# Patient Record
Sex: Male | Born: 1960 | Race: Black or African American | Hispanic: No | Smoking: Current every day smoker
Health system: Southern US, Community
[De-identification: ages and names within clinical notes are randomized; demographics above are authoritative.]

## PROBLEM LIST (undated history)

## (undated) DIAGNOSIS — M549 Dorsalgia, unspecified: Secondary | ICD-10-CM

---

## 1999-07-13 ENCOUNTER — Emergency Department (HOSPITAL_COMMUNITY): Admission: EM | Admit: 1999-07-13 | Discharge: 1999-07-13 | Payer: Self-pay | Admitting: Emergency Medicine

## 2003-03-05 ENCOUNTER — Emergency Department (HOSPITAL_COMMUNITY): Admission: EM | Admit: 2003-03-05 | Discharge: 2003-03-05 | Payer: Self-pay | Admitting: Emergency Medicine

## 2003-03-05 ENCOUNTER — Encounter: Payer: Self-pay | Admitting: *Deleted

## 2003-03-07 ENCOUNTER — Emergency Department (HOSPITAL_COMMUNITY): Admission: EM | Admit: 2003-03-07 | Discharge: 2003-03-07 | Payer: Self-pay | Admitting: *Deleted

## 2003-03-24 ENCOUNTER — Encounter: Payer: Self-pay | Admitting: Emergency Medicine

## 2003-03-24 ENCOUNTER — Emergency Department (HOSPITAL_COMMUNITY): Admission: EM | Admit: 2003-03-24 | Discharge: 2003-03-24 | Payer: Self-pay | Admitting: Emergency Medicine

## 2005-04-30 ENCOUNTER — Emergency Department (HOSPITAL_COMMUNITY): Admission: EM | Admit: 2005-04-30 | Discharge: 2005-04-30 | Payer: Self-pay | Admitting: Emergency Medicine

## 2008-04-14 ENCOUNTER — Emergency Department (HOSPITAL_COMMUNITY): Admission: EM | Admit: 2008-04-14 | Discharge: 2008-04-14 | Payer: Self-pay | Admitting: Family Medicine

## 2015-03-02 ENCOUNTER — Encounter (HOSPITAL_COMMUNITY): Payer: Self-pay | Admitting: Emergency Medicine

## 2015-03-02 ENCOUNTER — Emergency Department (HOSPITAL_COMMUNITY): Payer: Self-pay

## 2015-03-02 ENCOUNTER — Emergency Department (HOSPITAL_COMMUNITY)
Admission: EM | Admit: 2015-03-02 | Discharge: 2015-03-02 | Disposition: A | Discharge status: Home / Self Care | Payer: Self-pay | Attending: Emergency Medicine | Admitting: Emergency Medicine

## 2015-03-02 DIAGNOSIS — M25551 Pain in right hip: Secondary | ICD-10-CM | POA: Insufficient documentation

## 2015-03-02 DIAGNOSIS — M5417 Radiculopathy, lumbosacral region: Secondary | ICD-10-CM | POA: Insufficient documentation

## 2015-03-02 DIAGNOSIS — Z720 Tobacco use: Secondary | ICD-10-CM | POA: Insufficient documentation

## 2015-03-02 DIAGNOSIS — G8929 Other chronic pain: Secondary | ICD-10-CM | POA: Insufficient documentation

## 2015-03-02 DIAGNOSIS — M549 Dorsalgia, unspecified: Secondary | ICD-10-CM

## 2015-03-02 DIAGNOSIS — R200 Anesthesia of skin: Secondary | ICD-10-CM | POA: Insufficient documentation

## 2015-03-02 DIAGNOSIS — M5126 Other intervertebral disc displacement, lumbar region: Secondary | ICD-10-CM | POA: Insufficient documentation

## 2015-03-02 HISTORY — DX: Dorsalgia, unspecified: M54.9

## 2015-03-02 MED ORDER — OXYCODONE-ACETAMINOPHEN 5-325 MG PO TABS
1.0000 | ORAL_TABLET | Freq: Once | ORAL | Status: AC
  Administered 2015-03-02: 1 via ORAL
  Filled 2015-03-02: qty 1

## 2015-03-02 MED ORDER — DIAZEPAM 5 MG PO TABS
5.0000 mg | ORAL_TABLET | Freq: Once | ORAL | Status: AC
  Administered 2015-03-02: 5 mg via ORAL
  Filled 2015-03-02: qty 1

## 2015-03-02 MED ORDER — PREDNISONE 20 MG PO TABS: 40.0000 mg | ORAL_TABLET | Freq: Every day | ORAL | Status: AC

## 2015-03-02 MED ORDER — HYDROMORPHONE HCL 1 MG/ML IJ SOLN
1.0000 mg | Freq: Once | INTRAMUSCULAR | Status: AC
  Administered 2015-03-02: 1 mg via INTRAMUSCULAR
  Filled 2015-03-02: qty 1

## 2015-03-02 MED ORDER — HYDROCODONE-ACETAMINOPHEN 5-325 MG PO TABS: 1.0000 | ORAL_TABLET | Freq: Four times a day (QID) | ORAL | Status: AC | PRN

## 2015-03-02 NOTE — ED Notes (Signed)
Pt c/o flow back pain and right hip pain x 1 week. Pt has history of back pain and does heavy lifting.

## 2015-03-02 NOTE — ED Notes (Signed)
PA at the bedside.

## 2015-03-02 NOTE — ED Provider Notes (Signed)
CSN: [id number]     Arrival date & time 03/02/15  4098 History  This chart was scribed for non-physician practitioner, Lottie Mussel, PA-C, working with Mancel Bale, MD by Charline Bills, ED Scribe. This patient was seen in room TR06C/TR06C and the patient's care was started at 11:34 AM.   Chief Complaint  Patient presents with  . Back Pain  . Hip Pain   The history is provided by the patient. No language interpreter was used.   HPI Comments: Jesse Cruz is a 54 y.o. male, with a h/o chronic back pain, who presents to the Emergency Department complaining of sudden onset of lower back pain for the past 2 weeks. Pt states that he bent down to pick up a piece of wood 2 weeks ago and has had persistent pain since. He describes pain as a constant, sharp sensation that radiates into right hip and right leg. Pain is exacerbated with movement, palpation and BMs. He reports associated numbness/tingling that radiates down the posterior thigh into right foot and occasional urinary incontinence that he describes as "dripping" for the past 2 weeks. Pt has tried ibuprofen and resting with temporary relief. He denies constipation and bowel incontinence.   Past Medical History  Diagnosis Date  . Back pain    History reviewed. No pertinent past surgical history. No family history on file. History  Substance Use Topics  . Smoking status: Current Every Day Smoker  . Smokeless tobacco: Not on file  . Alcohol Use: Yes    Review of Systems  Gastrointestinal: Negative for constipation.  Musculoskeletal: Positive for back pain and arthralgias.  Neurological: Positive for numbness.   Allergies  Review of patient's allergies indicates no known allergies.  Home Medications   Prior to Admission medications   Not on File   BP 136/93 mmHg  Pulse 55  Temp(Src) 98 F (36.7 C) (Oral)  Resp 16  Ht  (1.88 m)  Wt 195 lb (88.451 kg)  BMI 25.03 kg/m2  SpO2 99% Physical Exam  Constitutional:  He is oriented to person, place, and time. He appears well-developed and well-nourished. No distress.  HENT:  Head: Normocephalic and atraumatic.  Eyes: Conjunctivae and EOM are normal.  Neck: Neck supple. No tracheal deviation present.  Cardiovascular: Normal rate.   Pulmonary/Chest: Effort normal. No respiratory distress.  Musculoskeletal: Normal range of motion.  Midline lumbar spine tenderness. Pain with bilateral straight leg raise, right worse than left  Neurological: He is alert and oriented to person, place, and time.  5/5 and equal lower extremity strength. 2+ and equal patellar reflexes bilaterally. Pt able to dorsiflex bilateral toes and feet with good strength against resistance. Equal sensation bilaterally over thighs and lower legs.   Skin: Skin is warm and dry.  Psychiatric: He has a normal mood and affect. His behavior is normal.  Nursing note and vitals reviewed.  ED Course  Procedures (including critical care time) DIAGNOSTIC STUDIES: Oxygen Saturation is 99% on RA, normal by my interpretation.    COORDINATION OF CARE: 11:37 AM-Discussed treatment plan which includes MRI, Dilaudid injection, Percocet and Valium with pt at bedside and pt agreed to plan.   Labs Review Labs Reviewed - No data to display  Imaging Review Mr Lumbar Spine Wo Contrast  03/02/2015   CLINICAL DATA:  Low back pain with right hip and leg pain  EXAM: MRI LUMBAR SPINE WITHOUT CONTRAST  TECHNIQUE: Multiplanar, multisequence MR imaging of the lumbar spine was performed. No intravenous contrast was administered.  COMPARISON:  None.  FINDINGS: Normal lumbar alignment. Negative for fracture or mass lesion. Conus medullaris normal and terminates at mid L2. Fatty changes in the filum terminale at the L3 and L4 levels. This measures 1.5 mm in thickness. Negative for tethered cord. Mild congenital stenosis of the lumbar spine due to short pedicles.  T12-L1:  Slight disc degeneration without stenosis  L1-2:  Disc degeneration with fatty changes in the endplates and anterior osteophytes. Small central disc protrusion. Mild spinal stenosis.  L2-3: Diffuse bulging of the disc with mild facet degeneration. Mild spinal stenosis  L3-4: Diffuse bulging of the disc. Small central disc protrusion. Facet hypertrophy and moderate spinal stenosis.  L4-5: Mild bulging of the disc. Mild facet degeneration and mild spinal stenosis.  L5-S1: Small right sided disc protrusion extending into the foramen. There is probable impingement of the right S1 nerve root. Right L5 nerve root appears to exit without compression or significant encroachment.  IMPRESSION: Congenital lumbar stenosis. Multilevel disc and facet degeneration causing spinal stenosis. Mild spinal stenosis L1-2, L2-3, L3-4  Moderate spinal stenosis L3-4  Right foraminal disc protrusion L5-S1 with expected impingement of the right S1 nerve root.   Electronically Signed   By: Marlan Palauharles  Clark M.D.   On: 03/02/2015 13:26    EKG Interpretation None      MDM   Final diagnoses:  Back pain  Herniated lumbar intervertebral disc  Lumbosacral radiculopathy    Patient with lower back pain, worsening. Pain radiating down right leg. He reports some urinary incontinence which has been new in the last 2 weeks. He also reports some difficulty with bowel movements. Exam unremarkable. Will get MR lumbar spine to rule out cauda equina.  Filed Vitals:   03/02/15 0943 03/02/15 0958  BP: 136/93   Pulse: 55   Temp: 98 F (36.7 C)   TempSrc: Oral   Resp: 16   Height:  6\' 2"  (1.88 m)  Weight:  195 lb (88.451 kg)  SpO2: 99%      MRI is negative for cauda equina. It does show some disc herniation with possible impingement on the S1 nerve root. Patient is feeling better after medications. Plan to discharge home follow-up with neurosurgery. Will start on prednisone and Norco for pain. Return precautions discussed.  Filed Vitals:   03/02/15 0943 03/02/15 0958 03/02/15 1245  03/02/15 1346  BP: 136/93  126/84 120/86  Pulse: 55  54 52  Temp: 98 F (36.7 C)     TempSrc: Oral     Resp: 16  16 16   Height:  6\' 2"  (1.88 m)    Weight:  195 lb (88.451 kg)    SpO2: 99%  99% 99%   I personally performed the services described in this documentation, which was scribed in my presence. The recorded information has been reviewed and is accurate.   Jaynie Crumbleatyana Naje Rice, PA-C 03/02/15 1414  Mancel BaleElliott Wentz, MD 03/02/15 [phone]

## 2015-03-02 NOTE — ED Notes (Signed)
Pt reports taking OTC for pain with out relief. Pt does report heat to back helps with pain.

## 2015-03-02 NOTE — ED Notes (Signed)
Declined W/C at D/C and was escorted to lobby by RN. 

## 2015-03-02 NOTE — Discharge Instructions (Signed)
Take Norco as prescribed as needed for pain. Take Tylenol or Motrin for mild pain. Prednisone as prescribed until all gone for inflammation. Try to follow with Dr. Jordan Likes, if unable [facility]. He exercises below. Do them daily. Return if worsening symptoms.  Herniated Disk A herniated disk occurs when a disk in your spine bulges out too far. This condition is also called a ruptured disk or slipped disk. Your spine (backbone) is made up of bones called vertebrae. Between each pair of vertebrae is an oval disk with a soft, spongy center that acts as a shock absorber when you move. The spongy center is surrounded by a tough outer ring. When you have a herniated disk, the spongy center of the disk bulges out or ruptures through the outer ring. A herniated disk can press on a nerve between your vertebrae and cause pain. A herniated disk can occur anywhere in your back or neck area, but the lower back is the most common spot. CAUSES  In many cases, a herniated disk occurs just from getting older. As you age, the spongy insides of your disks tend to shrink and dry out. A herniated disk can result from gradual wear and tear. Injury or sudden strain can also cause a herniated disk.  RISK FACTORS Aging is the main risk factor for a herniated disk. Other risk factors include:  Being a man between the ages of 36 and 16 years.  Having a job that requires heavy lifting, bending, or twisting.  Having a job that requires long hours of driving.  Not getting enough exercise.  Being overweight.  Smoking. SIGNS AND SYMPTOMS  Signs and symptoms depend on which disk is herniated.  For a herniated disk in the lower back, you may have sharp pain in:  One part of your leg, hip, or buttocks.  The back of your calf.  The top or sole of your foot (sciatica).   For a herniated disk in the neck, you may feel pain:  When you move your neck.  Near or over your shoulder  blade.  That moves to your upper arm, forearm, or fingers.   You may also have muscle weakness. It may be hard to:  Lift your leg or arm.  Stand on your toes.  Squeeze tightly with one of your hands.  Other symptoms can include:  Numbness or tingling in the affected areas of your body.  Loss of bladder or bowel control. This is a rare but serious sign of a severe herniated disk in the lower back. DIAGNOSIS  Your health care provider will do a physical exam. During this exam, you may have to move certain body parts or assume various positions. For example, your health care provider may do the straight-leg test. This is a good way to test for a herniated disk in your lower back. In this test, the health care provider lifts your leg while you lie on your back. This is to see if you feel pain down your leg. Your health care provider will also check for numbness or loss of feeling.  Your health care provider will also check your:  Reflexes.  Muscle strength.  Posture.  Other tests may be done to help in making a diagnosis. These may include:  An X-ray of the spine to rule out other causes of back pain.   Other imaging studies, such as an MRI or CT scan. This is to check whether the herniated  disk is pressing on your spinal canal.  Electromyography (EMG). This test checks the nerves that control muscles. It is sometimes used to identify the specific area of nerve involvement.  TREATMENT  In many cases, herniated disk symptoms go away over a period of days or weeks. You will most likely be free of symptoms in 3-4 months. Treatment may include the following:  The initial treatment for a herniated disk is ashort period of rest.  Bed rest is often limited to 1 or 2 days. Resting for too long delays recovery.  If you have a herniated disk in your lower back, you should avoid sitting as much as possible because sitting increases pressure on the disk.  Medicines. These may include:    Nonsteroidal anti-inflammatory drugs (NSAIDs).  Muscle relaxants for back spasms.  Narcotic pain medicine if your pain is very bad.   Steroid injections. You may need these along the involved nerve root to help control pain. The steroid is injected in the area of the herniated disk. It helps by reducing swelling around the disk.  Physical therapy. This may include exercises to strengthen the muscles that help support your spine.   You may need surgery if other treatments do not work.  HOME CARE INSTRUCTIONS Follow all your health care provider's instructions. These may include:  Take all medicines as directed by your health care provider.  Rest for 2 days and then start moving.  Do not sit or stand for long periods of time.  Maintain good posture when sitting and standing.  Avoid movements that cause pain, such as bending or lifting.  When you are able to start lifting things again:  [place] with your knees.  Keep your back straight.  Hold heavy objects close to your body.  If you are overweight, ask your health care provider to help you start a weight-loss program.  When you are able to start exercising, ask your health care provider how much and what type of exercise is best for you.  Work with a physical therapist on stretching and strengthening exercises for your back.  Do not wear high-heeled shoes.  Do not sleep on your belly.  Do not smoke.  Keep all follow-up visits as directed by your health care provider. SEEK MEDICAL CARE IF:  You have back or neck pain that is not getting better after 4 weeks.  You have very bad pain in your back or neck.  You develop numbness, tingling, or weakness along with pain. SEEK IMMEDIATE MEDICAL CARE IF:   You have numbness, tingling, or weakness that makes you unable to use your arms or legs.  You lose control of your bladder or bowels.  You have dizziness or fainting.  You have shortness of breath.  MAKE SURE  YOU:   Understand these instructions.  Will watch your condition.  Will get help right away if you are not doing well or get worse. Document Released: 07/29/2000 Document Revised: 12/16/2013 Document Reviewed: 07/05/2013 [facility] Patient Information 2015 [employer] [place]. This information is not intended to replace advice given to you by your health care provider. Make sure you discuss any questions you have with your health care provider.

## 2015-03-06 ENCOUNTER — Emergency Department (HOSPITAL_COMMUNITY)
Admission: EM | Admit: 2015-03-06 | Discharge: 2015-03-06 | Disposition: A | Discharge status: Home / Self Care | Payer: Self-pay | Attending: Emergency Medicine | Admitting: Emergency Medicine

## 2015-03-06 ENCOUNTER — Encounter (HOSPITAL_COMMUNITY): Payer: Self-pay | Admitting: Emergency Medicine

## 2015-03-06 DIAGNOSIS — Z7952 Long term (current) use of systemic steroids: Secondary | ICD-10-CM | POA: Insufficient documentation

## 2015-03-06 DIAGNOSIS — Z720 Tobacco use: Secondary | ICD-10-CM | POA: Insufficient documentation

## 2015-03-06 DIAGNOSIS — M5416 Radiculopathy, lumbar region: Secondary | ICD-10-CM | POA: Insufficient documentation

## 2015-03-06 MED ORDER — KETOROLAC TROMETHAMINE 60 MG/2ML IM SOLN
60.0000 mg | Freq: Once | INTRAMUSCULAR | Status: AC
  Administered 2015-03-06: 60 mg via INTRAMUSCULAR
  Filled 2015-03-06: qty 2

## 2015-03-06 MED ORDER — TRAMADOL HCL 50 MG PO TABS: 50.0000 mg | ORAL_TABLET | Freq: Four times a day (QID) | ORAL | Status: AC | PRN

## 2015-03-06 MED ORDER — DIAZEPAM 10 MG PO TABS: 10.0000 mg | ORAL_TABLET | Freq: Three times a day (TID) | ORAL | Status: AC | PRN

## 2015-03-06 MED ORDER — DIAZEPAM 5 MG/ML IJ SOLN
10.0000 mg | Freq: Once | INTRAMUSCULAR | Status: AC
  Administered 2015-03-06: 10 mg via INTRAVENOUS
  Filled 2015-03-06: qty 2

## 2015-03-06 NOTE — Progress Notes (Signed)
CSW c/s received, however pt requires medication assistance.  RNCM notified.  CSW will sign off, please re-consult as necessary.

## 2015-03-06 NOTE — Care Management (Signed)
This Case Manager received phone call from Jesse Cruz, Jesse Cruz ED Case Manager that patient does not have insurance or a PCP. Patient needing ED follow-up appointment. Appointment obtained on 03/13/15 at 1045 with Dr. Venetia Night.  Appointment placed on AVS.  [name] updated.

## 2015-03-06 NOTE — ED Notes (Signed)
pt states sudden onset of lower back and right sided hip pain that started today while working. Pt states seen on monday and told he had 3 herniated discs, states he has to work in order to get pain and has been taking prednison and tylenol but today pain got increasingly worse and pt was unable to move. Nad noted at this time. Pt on phone playing a game. Pt denies any urinary incontinence or bowel incontinence.

## 2015-03-06 NOTE — Discharge Planning (Signed)
Jesse Cruz [name], RN, [place], [place] [phone] ED CM consulted to meet with patient concerning f/u care with PCP and patient does not have insurance. Pt presented to [facility] ED today with Right Hip Pain.  Met with patient at bedside, confirmed informaton. Pt  reports not having access to f/u care with PCP, or insurance coverage. Discussed with patient importance and benefits of  establishing PCP, and not utilizing the ED for primary care needs. Pt verbalized understanding and is in agreement. Discussed other options, provided list of local  affordable PCPs.  Pt voiced interest in the [facility] and [facility].  [facility] Brochure given with address, phone number, and the services highlighted. Explained that there is a Customer service manager on site who will assist with [employer] and process.  NCM secured appointment on July 29 @1045 .  Information relayed to pt.  Pt verbalizes importance of keeping appointment.  Pt may fill prescriptions at this location.

## 2015-03-06 NOTE — Discharge Instructions (Signed)

## 2015-03-06 NOTE — ED Notes (Signed)
Case management at bedside.

## 2015-03-06 NOTE — ED Provider Notes (Signed)
CSN: [id number]     Arrival date & time 03/06/15  1124 History   First MD Initiated Contact with Patient 03/06/15 1127     No chief complaint on file.    (Consider location/radiation/quality/duration/timing/severity/associated sxs/prior Treatment) HPI   Jesse Cruz is a 54 y.o. male presents for evaluation of "right hip pain". He sustained a flexion injury to the low back, 03/02/2015, when he is bending over to pick up wood. He was evaluated then, diagnosed with S1 nerve compression, and herniated disc on MRI. He was prescribed prednisone, and Norco. He started the prednisone, but could not afford the Norco so is taking Tylenol only for pain. He tried to work this morning but the pain was too severe. He works as a Forensic scientist". No prior chronic back pain. He denies fever, chills, nausea, vomiting, weakness or dizziness. There are no other known modifying factors.   Past Medical History  Diagnosis Date  . Back pain    No past surgical history on file. No family history on file. History  Substance Use Topics  . Smoking status: Current Every Day Smoker  . Smokeless tobacco: Not on file  . Alcohol Use: Yes    Review of Systems  All other systems reviewed and are negative.     Allergies  Review of patient's allergies indicates no known allergies.  Home Medications   Prior to Admission medications   Medication Sig Start Date End Date Taking? Authorizing Provider  HYDROcodone-acetaminophen (NORCO) 5-325 MG per tablet Take 1 tablet by mouth every 6 (six) hours as needed for moderate pain. 03/02/15   Tatyana Kirichenko, PA-C  predniSONE (DELTASONE) 20 MG tablet Take 2 tablets (40 mg total) by mouth daily. 03/02/15   Tatyana Kirichenko, PA-C   There were no vitals taken for this visit. Physical Exam  Constitutional: He is oriented to person, place, and time. He appears well-developed and well-nourished. He appears distressed (he is uncomfortable.).  HENT:  Head: Normocephalic  and atraumatic.  Right Ear: External ear normal.  Left Ear: External ear normal.  Eyes: Conjunctivae and EOM are normal. Pupils are equal, round, and reactive to light.  Neck: Normal range of motion and phonation normal. Neck supple.  Cardiovascular: Normal rate, regular rhythm and normal heart sounds.   Pulmonary/Chest: Effort normal and breath sounds normal. He exhibits no bony tenderness.  Abdominal: Soft. There is no tenderness.  Musculoskeletal: Normal range of motion.  He is able to move from wheelchair to stretcher on his own. He has severe tenderness of the right lower lumbar region to palpation.  Neurological: He is alert and oriented to person, place, and time. No cranial nerve deficit or sensory deficit. He exhibits normal muscle tone. Coordination normal.  Skin: Skin is warm, dry and intact.  Psychiatric: He has a normal mood and affect. His behavior is normal. Judgment and thought content normal.  Nursing note and vitals reviewed.   ED Course  Procedures (including critical care time) Medications  ketorolac (TORADOL) injection 60 mg (not administered)  diazepam (VALIUM) injection 10 mg (not administered)    Patient Vitals for the past 24 hrs:  BP Temp Temp src Pulse Resp SpO2 Height Weight  03/06/15 1133 155/96 mmHg 98.6 F (37 C) Oral 65 24 100 %  (1.88 m) 195 lb (88.451 kg)    1:47 PM Reevaluation with update and discussion. After initial assessment and treatment, an updated evaluation reveals the patient looks and feels more comfortable at this time. On attempted ambulation. He  had continued pain in the right low back. At this time. He states that he has only "$6 to last the rest, the week". He states that he will not be able to afford any prescription medications. He asked to speak with a Child psychotherapist. Bama Hanselman L   15:30- . He has been seen by care management who arranged outpatient appointment with a medical physician, and stated that he could get his  prescriptions filled today at the [facility] Review Labs Reviewed - No data to display  Imaging Review No results found.   EKG Interpretation None      MDM   Final diagnoses:  Lumbar radicular pain    Lumbar radiculopathy with pain. Possible muscle spasm component. Doubt cauda equina syndrome, fracture or acute spinal infection.  Nursing Notes Reviewed/ Care Coordinated Applicable Imaging Reviewed Interpretation of Laboratory Data incorporated into ED treatment  The patient appears reasonably screened and/or stabilized for discharge and I doubt any other medical condition or other [facility] requiring further screening, evaluation, or treatment in the ED at this time prior to discharge.  Plan: Home Medications- Valium, Tramadol; Home Treatments- rest; return here if the recommended treatment, does not improve the symptoms; Recommended follow up- PCP prn     Mancel Bale, MD 03/06/15 [phone]

## 2015-03-06 NOTE — ED Notes (Signed)
Pt reports sudden onset right hip pain and numbness approx prior. Pt tearful, appears in distress. Reports unable to walk since pain started. MAE, distal circulation intact.

## 2015-03-06 NOTE — ED Notes (Addendum)
Pt unable to bear weight on right leg, pt unsteady gait and states "my right leg feels numb and it feels like its not there." Pt helped back into bed, pt has no neuro deficits to either side, pt able to lift both legs, states lifting right leg causes increase in pain. Dr. Effie Shy notified.

## 2015-03-13 ENCOUNTER — Inpatient Hospital Stay: Payer: Self-pay | Admitting: Family Medicine

## 2017-01-23 IMAGING — MR MR LUMBAR SPINE W/O CM
4 of 6 series · 19 of 48 positions shown · non-contrast
Comparison: None.

CLINICAL DATA: Low back pain with right hip and leg pain

EXAM:
MRI LUMBAR SPINE WITHOUT CONTRAST
TECHNIQUE: Multiplanar, multisequence MR imaging of the lumbar spine was
performed. No intravenous contrast was administered.

[Series 3: T2 · sagittal · 4.0mm · 0.57mm/px · 5 of 14 slices shown (1 of 2)]
[im 1/14]
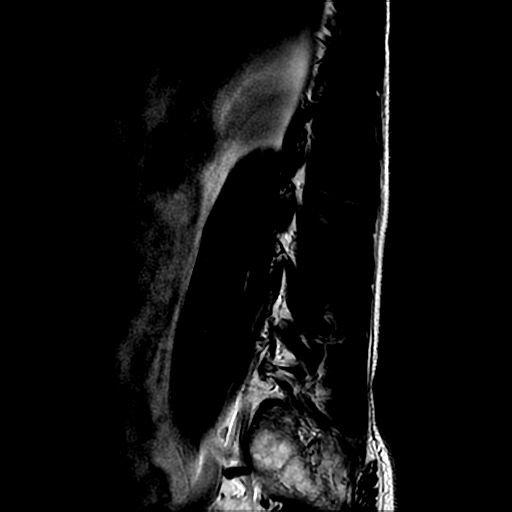
[im 4/14]
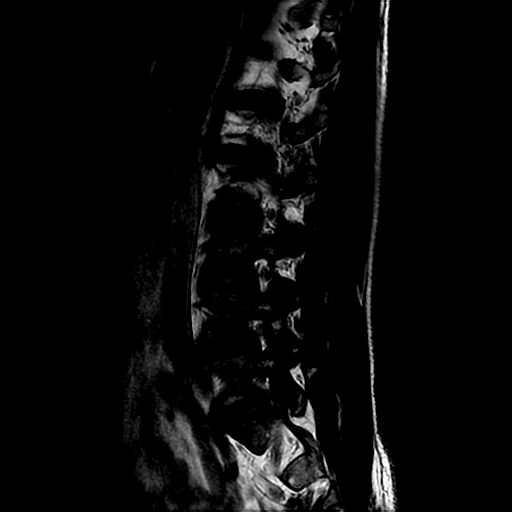
[im 7/14]
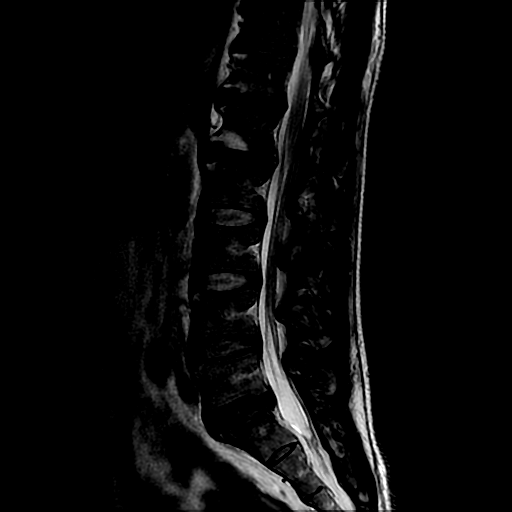
[im 10/14]
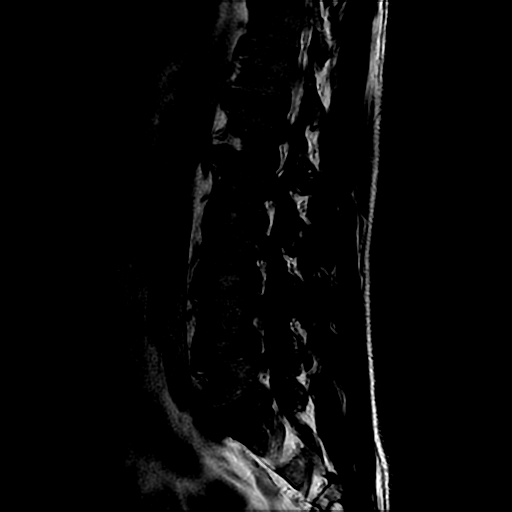
[im 14/14]
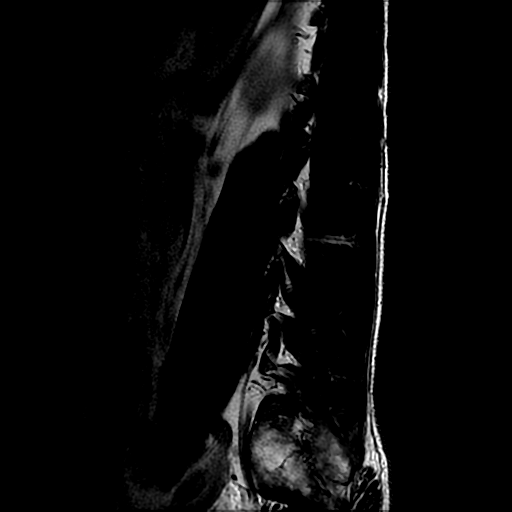

[Series 6: T1 · sagittal · 4.0mm · 0.59mm/px · 3 of 14 slices shown (1 of 2)]
[im 1/14]
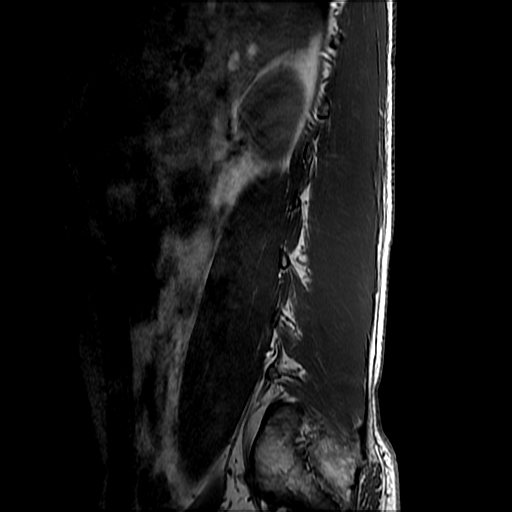
[im 7/14]
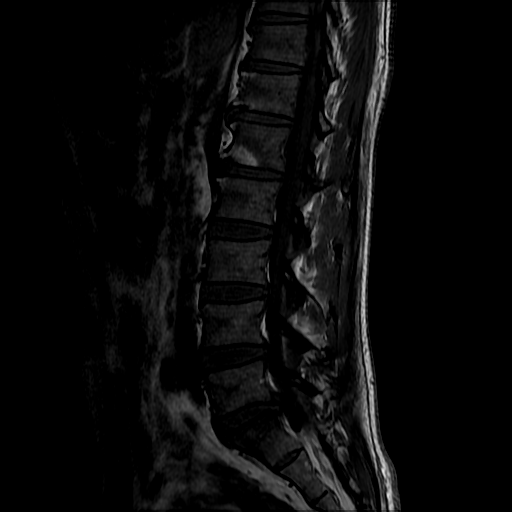
[im 14/14]
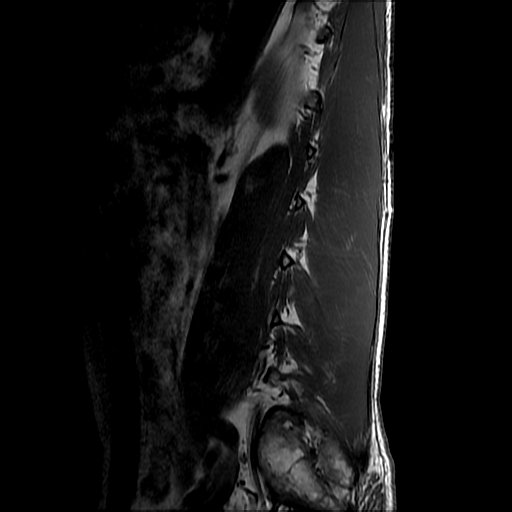

[Series 9: T2 · axial · 4.0mm · 0.39mm/px · z∈[-135,+80]mm · 8 of 48 slices shown (2 of 2)]
[im 1/48]
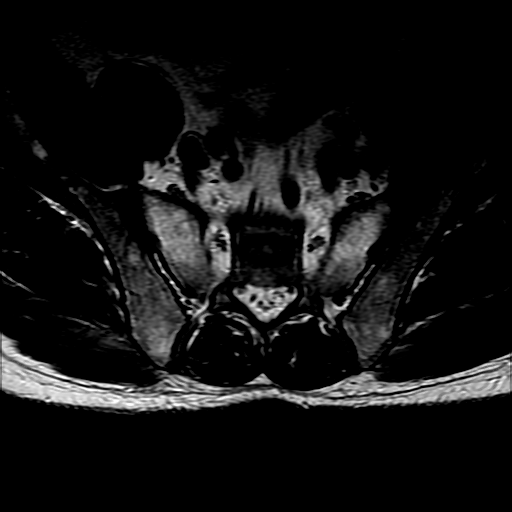
[im 7/48]
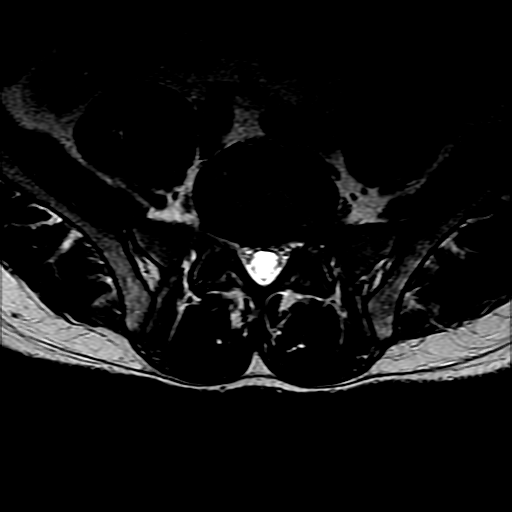
[im 14/48]
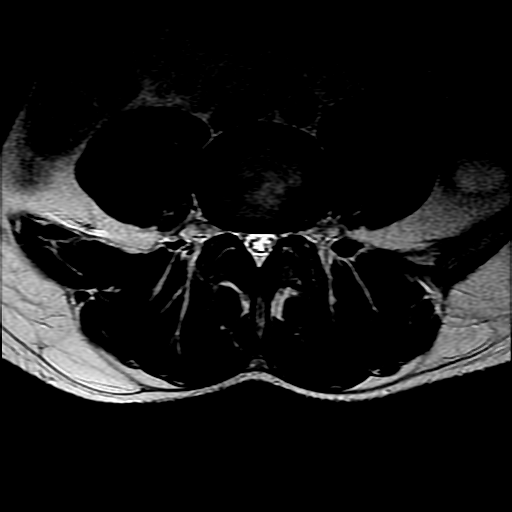
[im 21/48]
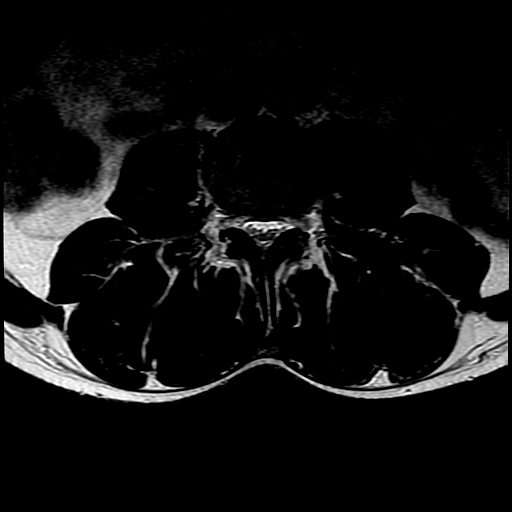
[im 24/48]
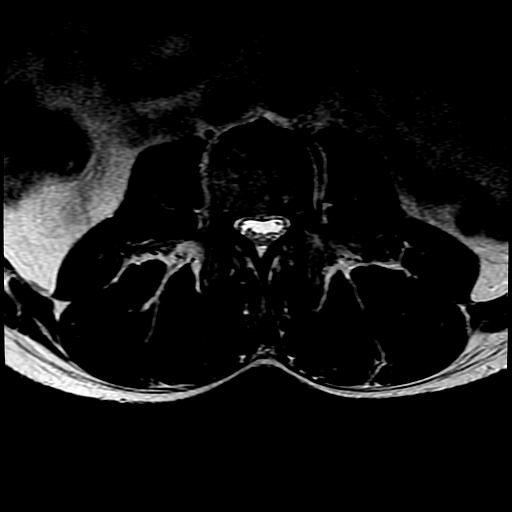
[im 27/48]
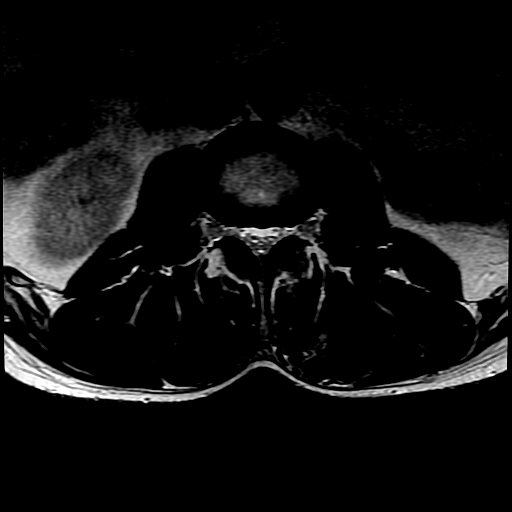
[im 34/48]
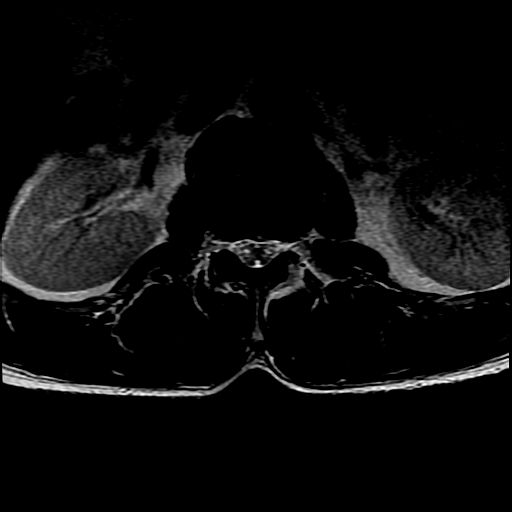
[im 41/48]
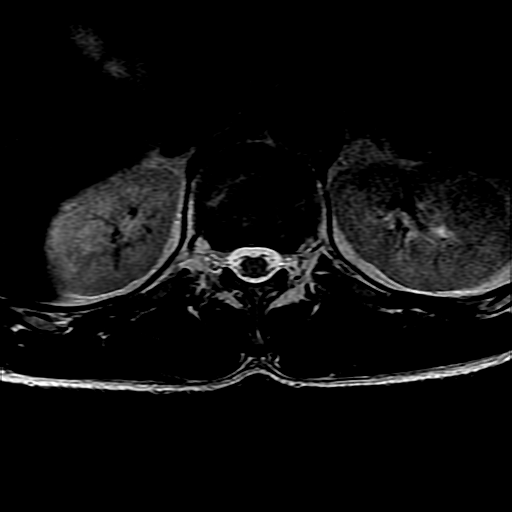

[Series 10: T1 · axial · 4.0mm · 0.39mm/px · z∈[-105,+80]mm · 3 of 48 slices shown (2 of 2)]
[im 7/48]
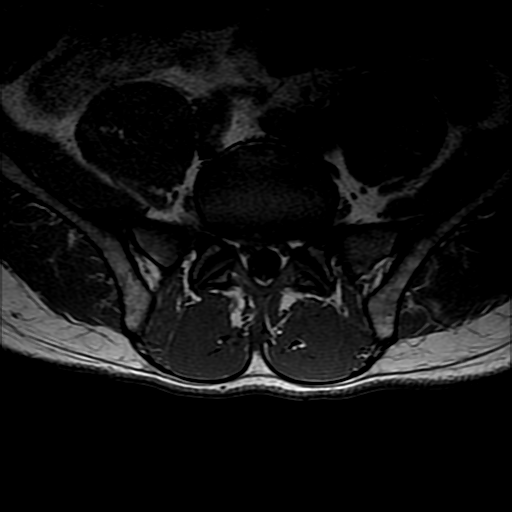
[im 24/48]
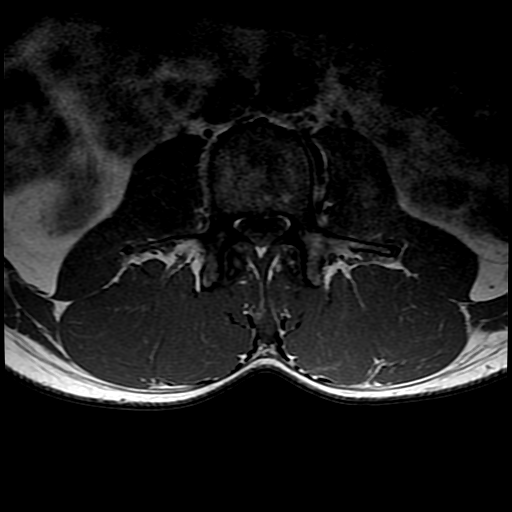
[im 41/48]
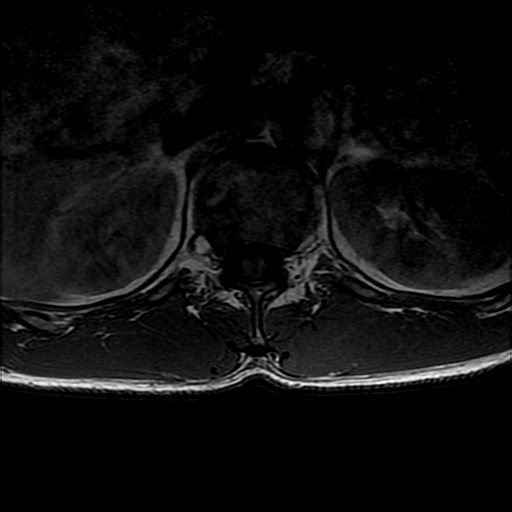

[19 of 48 positions shown; findings below may reference images not displayed]

FINDINGS: Normal lumbar alignment. Negative for fracture or mass lesion. Conus
medullaris normal and terminates at mid L2. Fatty changes in the
filum terminale at the L3 and L4 levels. This measures 1.5 mm in
thickness. Negative for tethered cord. Mild congenital stenosis of
the lumbar spine due to short pedicles.

T12-L1:  Slight disc degeneration without stenosis

L1-2: Disc degeneration with fatty changes in the endplates and
anterior osteophytes. Small central disc protrusion. Mild spinal
stenosis.

L2-3: Diffuse bulging of the disc with mild facet degeneration. Mild
spinal stenosis

L3-4: Diffuse bulging of the disc. Small central disc protrusion.
Facet hypertrophy and moderate spinal stenosis.

L4-5: Mild bulging of the disc. Mild facet degeneration and mild
spinal stenosis.

L5-S1: Small right sided disc protrusion extending into the foramen.
There is probable impingement of the right S1 nerve root. Right L5
nerve root appears to exit without compression or significant
encroachment.
IMPRESSION: Congenital lumbar stenosis. Multilevel disc and facet degeneration
causing spinal stenosis. Mild spinal stenosis L1-2, L2-3, L3-4

Moderate spinal stenosis L3-4

Right foraminal disc protrusion L5-S1 with expected impingement of
the right S1 nerve root.
